# Patient Record
Sex: Female | Born: 1940
Health system: Southern US, Community
[De-identification: ages and names within clinical notes are randomized; demographics above are authoritative.]

## PROBLEM LIST (undated history)

## (undated) DIAGNOSIS — G43909 Migraine, unspecified, not intractable, without status migrainosus: Secondary | ICD-10-CM

## (undated) DIAGNOSIS — N200 Calculus of kidney: Secondary | ICD-10-CM

## (undated) DIAGNOSIS — I10 Essential (primary) hypertension: Secondary | ICD-10-CM

## (undated) DIAGNOSIS — E785 Hyperlipidemia, unspecified: Secondary | ICD-10-CM

## (undated) HISTORY — PX: CHOLECYSTECTOMY: SHX55

## (undated) HISTORY — PX: CARDIAC ELECTROPHYSIOLOGY MAPPING AND ABLATION: SHX1292

## (undated) HISTORY — PX: APPENDECTOMY: SHX54

## (undated) HISTORY — PX: HEMORROIDECTOMY: SUR656

## (undated) HISTORY — PX: CARDIAC CATHETERIZATION: SHX172

---

## 2011-08-20 ENCOUNTER — Emergency Department (HOSPITAL_COMMUNITY): Payer: Medicare Other

## 2011-08-20 ENCOUNTER — Emergency Department (HOSPITAL_COMMUNITY)
Admission: EM | Admit: 2011-08-20 | Discharge: 2011-08-20 | Disposition: A | Discharge status: Home / Self Care | Payer: Medicare Other | Attending: Emergency Medicine | Admitting: Emergency Medicine

## 2011-08-20 ENCOUNTER — Encounter: Payer: Self-pay | Admitting: *Deleted

## 2011-08-20 ENCOUNTER — Other Ambulatory Visit: Payer: Self-pay

## 2011-08-20 DIAGNOSIS — R42 Dizziness and giddiness: Secondary | ICD-10-CM | POA: Insufficient documentation

## 2011-08-20 DIAGNOSIS — R5383 Other fatigue: Secondary | ICD-10-CM | POA: Insufficient documentation

## 2011-08-20 DIAGNOSIS — E860 Dehydration: Secondary | ICD-10-CM | POA: Insufficient documentation

## 2011-08-20 DIAGNOSIS — R0602 Shortness of breath: Secondary | ICD-10-CM | POA: Insufficient documentation

## 2011-08-20 DIAGNOSIS — R5381 Other malaise: Secondary | ICD-10-CM | POA: Insufficient documentation

## 2011-08-20 DIAGNOSIS — R531 Weakness: Secondary | ICD-10-CM

## 2011-08-20 HISTORY — DX: Essential (primary) hypertension: I10

## 2011-08-20 HISTORY — DX: Calculus of kidney: N20.0

## 2011-08-20 LAB — URINALYSIS, ROUTINE W REFLEX MICROSCOPIC
Ketones, ur: NEGATIVE mg/dL
Leukocytes, UA: NEGATIVE
Nitrite: NEGATIVE
Protein, ur: NEGATIVE mg/dL
Urobilinogen, UA: 0.2 mg/dL (ref 0.0–1.0)

## 2011-08-20 LAB — BASIC METABOLIC PANEL
Calcium: 9.6 mg/dL (ref 8.4–10.5)
Creatinine, Ser: 1.03 mg/dL (ref 0.50–1.10)
GFR calc Af Amer: 63 mL/min — ABNORMAL LOW (ref >90)
GFR calc non Af Amer: 54 mL/min — ABNORMAL LOW (ref >90)
Sodium: 136 mEq/L (ref 135–145)

## 2011-08-20 LAB — CBC
MCH: 27.8 pg (ref 26.0–34.0)
MCV: 81.2 fL (ref 78.0–100.0)
Platelets: 190 10*3/uL (ref 150–400)
RDW: 13.3 % (ref 11.5–15.5)

## 2011-08-20 MED ORDER — SODIUM CHLORIDE 0.9 % IV BOLUS (SEPSIS)
1000.0000 mL | Freq: Once | INTRAVENOUS | Status: AC
  Administered 2011-08-20: 1000 mL via INTRAVENOUS

## 2011-08-20 MED ORDER — SODIUM CHLORIDE 0.9 % IV SOLN: Freq: Once | INTRAVENOUS | Status: DC

## 2011-08-20 NOTE — ED Provider Notes (Addendum)
History     CSN: [id number]  Arrival date & time 08/20/11  4098   First MD Initiated Contact with Patient 08/20/11 1102      Chief Complaint  Patient presents with  . Shortness of Breath    (Consider location/radiation/quality/duration/timing/severity/associated sxs/prior treatment) Patient is a 71 y.o. female presenting with weakness. The history is provided by the patient.  Weakness Primary symptoms do not include headaches, syncope, loss of consciousness, altered mental status, dizziness, focal weakness, fever, nausea or vomiting. The symptoms began 3 to 5 days ago. The symptoms are worsening. The neurological symptoms are diffuse.  Additional symptoms include weakness. Additional symptoms do not include neck stiffness, pain or leg pain.  Pt states she is here visiting from [place], states over the last 3-4 days has been feeling very tired, has had low blood pressure, HR has been up. States all she feels like doing is laying down, when she stands up, becomes very weak and lightheaded. Denies fever, chills, chest pain, abdominal pain, swelling in legs, n/v/d.  Past Medical History  Diagnosis Date  . Hypertension   . Kidney stone     Past Surgical History  Procedure Date  . Cardiac electrophysiology mapping and ablation   . Cardiac catheterization   . Appendectomy   . Hemorroidectomy     No family history on file.  History  Substance Use Topics  . Smoking status: Never Smoker   . Smokeless tobacco: Not on file  . Alcohol Use: No    OB History    Grav Para Term Preterm Abortions TAB SAB Ect Mult Living                  Review of Systems  Constitutional: Negative for fever and chills.  HENT: Negative for ear pain, congestion, sore throat, neck pain and neck stiffness.   Respiratory: Positive for shortness of breath. Negative for choking and chest tightness.   Cardiovascular: Negative for chest pain, leg swelling and syncope.  Gastrointestinal: Negative.  Negative  for nausea and vomiting.  Genitourinary: Negative.   Musculoskeletal: Negative.   Skin: Negative.   Neurological: Positive for weakness and light-headedness. Negative for dizziness, focal weakness, loss of consciousness and headaches.  Psychiatric/Behavioral: Negative.  Negative for altered mental status.    Allergies  Penicillins  Home Medications   Current Outpatient Rx  Name Route Sig Dispense Refill  . AMITRIPTYLINE HCL 75 MG PO TABS Oral Take by mouth at bedtime.      Marland Kitchen METOPROLOL TARTRATE 100 MG PO TABS Oral Take 100 mg by mouth 2 (two) times daily.      Marland Kitchen VALSARTAN-HYDROCHLOROTHIAZIDE 160-12.5 MG PO TABS Oral Take 1 tablet by mouth daily.        BP 100/63  Pulse 118  Temp(Src) 97.8 F (36.6 C) (Oral)  Resp 16  Wt 165 lb (74.844 kg)  SpO2 100%  Physical Exam  Nursing note and vitals reviewed. Constitutional: She is oriented to person, place, and time. She appears well-developed and well-nourished. No distress.  HENT:  Head: Normocephalic and atraumatic.  Eyes: Conjunctivae are normal.  Neck: Neck supple.  Cardiovascular: Regular rhythm and normal heart sounds.        tachycardic  Pulmonary/Chest: Effort normal and breath sounds normal. No respiratory distress. She has no wheezes. She has no rales.  Abdominal: Soft. Bowel sounds are normal. There is no tenderness.  Musculoskeletal: Normal range of motion. She exhibits no edema.  Neurological: She is alert and oriented to person, place,  and time. She has normal reflexes. No cranial nerve deficit. She exhibits normal muscle tone. Coordination normal.  Skin: Skin is warm and dry. No erythema.  Psychiatric: She has a normal mood and affect.    ED Course  Procedures (including critical care time)  Pt appears dehydrated, in NAD. BP marginally low, orthostatic. Will start fluids, labs.   Results for orders placed during the hospital encounter of 08/20/11  CBC      Component Value Range   WBC 5.0  4.0 - 10.5 (K/uL)     RBC 4.89  3.87 - 5.11 (MIL/uL)   Hemoglobin 13.6  12.0 - 15.0 (g/dL)   HCT 40.9  81.1 - 91.4 (%)   MCV 81.2  78.0 - 100.0 (fL)   MCH 27.8  26.0 - 34.0 (pg)   MCHC 34.3  30.0 - 36.0 (g/dL)   RDW 78.2  95.6 - 21.3 (%)   Platelets 190  150 - 400 (K/uL)  BASIC METABOLIC PANEL      Component Value Range   Sodium 136  135 - 145 (mEq/L)   Potassium 3.8  3.5 - 5.1 (mEq/L)   Chloride 100  96 - 112 (mEq/L)   CO2 24  19 - 32 (mEq/L)   Glucose, Bld 110 (*) 70 - 99 (mg/dL)   BUN 38 (*) 6 - 23 (mg/dL)   Creatinine, Ser 0.86  0.50 - 1.10 (mg/dL)   Calcium 9.6  8.4 - 57.8 (mg/dL)   GFR calc non Af Amer 54 (*) >90 (mL/min)   GFR calc Af Amer 63 (*) >90 (mL/min)  URINALYSIS, ROUTINE W REFLEX MICROSCOPIC      Component Value Range   Color, Urine YELLOW  YELLOW    APPearance CLEAR  CLEAR    Specific Gravity, Urine 1.016  1.005 - 1.030    pH 5.5  5.0 - 8.0    Glucose, UA NEGATIVE  NEGATIVE (mg/dL)   Hgb urine dipstick NEGATIVE  NEGATIVE    Bilirubin Urine NEGATIVE  NEGATIVE    Ketones, ur NEGATIVE  NEGATIVE (mg/dL)   Protein, ur NEGATIVE  NEGATIVE (mg/dL)   Urobilinogen, UA 0.2  0.0 - 1.0 (mg/dL)   Nitrite NEGATIVE  NEGATIVE    Leukocytes, UA NEGATIVE  NEGATIVE    Dg Chest 2 View  08/20/2011  *RADIOLOGY REPORT*  Clinical Data: Shortness of breath  CHEST - 2 VIEW  Comparison: None  Findings: Cardiomediastinal silhouette is unremarkable.  No acute infiltrate or pleural effusion.  No pulmonary edema.  Mild hyperinflation.  Mild degenerative changes mid thoracic spine. There is a nodular lesion in the right midlung measures about 8 mm. Further evaluation with CT of the chest is recommended.  IMPRESSION: Mild hyperinflation.  Nodular lesion right midlung measures about 8 mm.  Further evaluation with CT of the chest is recommended. No acute infiltrate or pulmonary edema.  Original Report Authenticated By: Natasha Mead, M.D.    BUN/Cr 38/1... Suspect dehydration. NS 2 L bolus administered. Pt feeling  better, ambulated in the hallway. VS now normal. Tachycardia resolved. Pt deneis cp, denies SOB. Will d/c home with follow up.    Date: 08/20/2011  Rate: 118  Rhythm: sinus tachycardia  QRS Axis: normal  Intervals: normal  ST/T Wave abnormalities: normal  Conduction Disutrbances:none  Narrative Interpretation:   Old EKG Reviewed: none available     MDM          Lottie Mussel, PA 08/20/11 1515  Lottie Mussel, [place] 09/20/11 1550

## 2011-08-20 NOTE — ED Notes (Signed)
EAV:[id number]> Expected date:<BR> Expected time:<BR> Means of arrival:<BR> Comments:<BR> Hold per charge nurse

## 2011-08-20 NOTE — ED Provider Notes (Signed)
Medical screening examination/treatment/procedure(s) were conducted as a shared visit with non-physician practitioner(s) and myself.  I personally evaluated the patient during the encounter Pt is orthostatic with elevation in BUN suggesting pre-renal, dehydration.  Pt felt much improved after 2L of IVF's.  Can d/c to home, return if worse.    Samantha Mccullough. [name], MD 08/20/11 1529

## 2011-08-20 NOTE — ED Notes (Signed)
Pt states "I started feeling bad on Saturday, I live in [place], [place], I know I am getting oxygen, I just feel I have to sigh with breaths, I feel good when I'm lying down, I just feel tired"

## 2011-08-20 NOTE — ED Notes (Signed)
Pt presents to ER with c/o increased generalized weakness since Saturday; denies SOB at this time, states "I really have not been short of breath its just that I get so weak when I get up to do anything that I just cant make it and have to stop and catch my breath." lung sounds clear bilaterally; 02 sat on RA 100%, respirations unlabored; intermittent nausea since Saturday but denies nausea at this time; bowel sounds normoactive in all quadrants; pt also reports low BP x 2 days when she checks it at home but reports hx of orthostatic hypotension; pt a&o x 4, daughter at bedside.

## 2011-08-20 NOTE — ED Notes (Signed)
Pt reports that she feels better following the first bag of fluids; reports ambulation was easier and she felt less tired.

## 2011-09-20 NOTE — ED Provider Notes (Signed)
See my prior note  Gavin Pound. Alic Hilburn, MD 09/20/11 2300

## 2016-02-13 ENCOUNTER — Encounter (HOSPITAL_COMMUNITY): Payer: Self-pay | Admitting: *Deleted

## 2016-02-13 ENCOUNTER — Observation Stay (HOSPITAL_COMMUNITY)
Admission: EM | Admit: 2016-02-13 | Discharge: 2016-02-14 | Disposition: A | Discharge status: Home / Self Care | Payer: Medicare Other | Attending: Internal Medicine | Admitting: Internal Medicine

## 2016-02-13 ENCOUNTER — Emergency Department (HOSPITAL_COMMUNITY): Payer: Medicare Other

## 2016-02-13 DIAGNOSIS — Z9049 Acquired absence of other specified parts of digestive tract: Secondary | ICD-10-CM | POA: Diagnosis not present

## 2016-02-13 DIAGNOSIS — I160 Hypertensive urgency: Secondary | ICD-10-CM | POA: Insufficient documentation

## 2016-02-13 DIAGNOSIS — E876 Hypokalemia: Secondary | ICD-10-CM | POA: Diagnosis not present

## 2016-02-13 DIAGNOSIS — R079 Chest pain, unspecified: Secondary | ICD-10-CM

## 2016-02-13 DIAGNOSIS — R0781 Pleurodynia: Principal | ICD-10-CM | POA: Insufficient documentation

## 2016-02-13 DIAGNOSIS — I471 Supraventricular tachycardia: Secondary | ICD-10-CM | POA: Insufficient documentation

## 2016-02-13 DIAGNOSIS — I10 Essential (primary) hypertension: Secondary | ICD-10-CM | POA: Diagnosis not present

## 2016-02-13 DIAGNOSIS — E785 Hyperlipidemia, unspecified: Secondary | ICD-10-CM | POA: Diagnosis not present

## 2016-02-13 DIAGNOSIS — G43909 Migraine, unspecified, not intractable, without status migrainosus: Secondary | ICD-10-CM | POA: Diagnosis not present

## 2016-02-13 DIAGNOSIS — Z79899 Other long term (current) drug therapy: Secondary | ICD-10-CM | POA: Insufficient documentation

## 2016-02-13 HISTORY — DX: Migraine, unspecified, not intractable, without status migrainosus: G43.909

## 2016-02-13 HISTORY — DX: Hyperlipidemia, unspecified: E78.5

## 2016-02-13 LAB — BASIC METABOLIC PANEL
Anion gap: 9 (ref 5–15)
BUN: 33 mg/dL — ABNORMAL HIGH (ref 6–20)
CHLORIDE: 101 mmol/L (ref 101–111)
CO2: 24 mmol/L (ref 22–32)
Calcium: 9.5 mg/dL (ref 8.9–10.3)
Creatinine, Ser: 1.07 mg/dL — ABNORMAL HIGH (ref 0.44–1.00)
GFR calc Af Amer: 58 mL/min — ABNORMAL LOW (ref >60)
GFR, EST NON AFRICAN AMERICAN: 50 mL/min — AB (ref >60)
Glucose, Bld: 117 mg/dL — ABNORMAL HIGH (ref 65–99)
POTASSIUM: 3.4 mmol/L — AB (ref 3.5–5.1)
Sodium: 134 mmol/L — ABNORMAL LOW (ref 135–145)

## 2016-02-13 LAB — CBC
HEMATOCRIT: 38.1 % (ref 36.0–46.0)
Hemoglobin: 13 g/dL (ref 12.0–15.0)
MCH: 28.1 pg (ref 26.0–34.0)
MCHC: 34.1 g/dL (ref 30.0–36.0)
MCV: 82.3 fL (ref 78.0–100.0)
PLATELETS: 170 10*3/uL (ref 150–400)
RBC: 4.63 MIL/uL (ref 3.87–5.11)
RDW: 13.1 % (ref 11.5–15.5)
WBC: 6.1 10*3/uL (ref 4.0–10.5)

## 2016-02-13 LAB — I-STAT TROPONIN, ED: Troponin i, poc: 0 ng/mL (ref 0.00–0.08)

## 2016-02-13 NOTE — ED Notes (Signed)
PT states that she began having deep pain in her chest a couple of hours ago; pt states that it was like pleuritic pain to start with; pt states that it has progressed to left sided / substernal chest pain; pt c/o shortness of breath with exertion; nausea and diaphoresis; pt states that her BP has been elevated as well; pt denies radiation of pain

## 2016-02-14 ENCOUNTER — Observation Stay (HOSPITAL_BASED_OUTPATIENT_CLINIC_OR_DEPARTMENT_OTHER): Payer: Medicare Other

## 2016-02-14 ENCOUNTER — Encounter (HOSPITAL_COMMUNITY): Payer: Self-pay

## 2016-02-14 ENCOUNTER — Emergency Department (HOSPITAL_COMMUNITY): Payer: Medicare Other

## 2016-02-14 DIAGNOSIS — I160 Hypertensive urgency: Secondary | ICD-10-CM | POA: Diagnosis present

## 2016-02-14 DIAGNOSIS — R072 Precordial pain: Secondary | ICD-10-CM | POA: Diagnosis not present

## 2016-02-14 DIAGNOSIS — E876 Hypokalemia: Secondary | ICD-10-CM | POA: Diagnosis not present

## 2016-02-14 DIAGNOSIS — G43909 Migraine, unspecified, not intractable, without status migrainosus: Secondary | ICD-10-CM | POA: Diagnosis present

## 2016-02-14 DIAGNOSIS — I10 Essential (primary) hypertension: Secondary | ICD-10-CM | POA: Diagnosis not present

## 2016-02-14 DIAGNOSIS — R079 Chest pain, unspecified: Secondary | ICD-10-CM

## 2016-02-14 DIAGNOSIS — E785 Hyperlipidemia, unspecified: Secondary | ICD-10-CM

## 2016-02-14 DIAGNOSIS — R0781 Pleurodynia: Secondary | ICD-10-CM | POA: Diagnosis not present

## 2016-02-14 LAB — ECHOCARDIOGRAM COMPLETE
CHL CUP DOP CALC LVOT VTI: 18.5 cm
E decel time: 169 msec
EERAT: 7.93
FS: 29 % (ref 28–44)
HEIGHTINCHES: 66 in
IVS/LV PW RATIO, ED: 1.11
LA ID, A-P, ES: 30 mm
LA diam end sys: 30 mm
LA diam index: 1.6 cm/m2
LA vol A4C: 22.3 ml
LA vol: 22.8 mL
LAVOLIN: 12.2 mL/m2
LDCA: 2.01 cm2
LV E/e' medial: 7.93
LV PW d: 9.07 mm — AB (ref 0.6–1.1)
LV TDI E'LATERAL: 9.36
LV TDI E'MEDIAL: 5.77
LV e' LATERAL: 9.36 cm/s
LVEEAVG: 7.93
LVOT diameter: 16 mm
LVOTPV: 91.2 cm/s
LVOTSV: 37 mL
MV Dec: 169
MV pk A vel: 99.4 m/s
MV pk E vel: 74.2 m/s
MVPG: 2 mmHg
WEIGHTICAEL: 2721.6 [oz_av]

## 2016-02-14 LAB — TROPONIN I: Troponin I: <0.03 ng/mL (ref <0.03)

## 2016-02-14 LAB — LIPID PANEL
CHOL/HDL RATIO: 3.4 ratio
CHOLESTEROL: 155 mg/dL (ref 0–200)
HDL: 46 mg/dL (ref >40)
LDL Cholesterol: 83 mg/dL (ref 0–99)
TRIGLYCERIDES: 129 mg/dL (ref <150)
VLDL: 26 mg/dL (ref 0–40)

## 2016-02-14 LAB — POTASSIUM: POTASSIUM: 3.9 mmol/L (ref 3.5–5.1)

## 2016-02-14 LAB — D-DIMER, QUANTITATIVE: D-Dimer, Quant: 0.71 ug/mL-FEU — ABNORMAL HIGH (ref 0.00–0.50)

## 2016-02-14 MED ORDER — POTASSIUM CHLORIDE 20 MEQ/15ML (10%) PO SOLN
20.0000 meq | Freq: Once | ORAL | Status: AC
  Administered 2016-02-14: 20 meq via ORAL
  Filled 2016-02-14: qty 15

## 2016-02-14 MED ORDER — NITROGLYCERIN 0.4 MG SL SUBL: 0.4000 mg | SUBLINGUAL_TABLET | SUBLINGUAL | Status: AC | PRN

## 2016-02-14 MED ORDER — HYDRALAZINE HCL 20 MG/ML IJ SOLN: 5.0000 mg | INTRAMUSCULAR | Status: DC | PRN

## 2016-02-14 MED ORDER — IRBESARTAN 150 MG PO TABS
150.0000 mg | ORAL_TABLET | Freq: Every day | ORAL | Status: DC
  Administered 2016-02-14: 150 mg via ORAL
  Filled 2016-02-14: qty 1

## 2016-02-14 MED ORDER — ASPIRIN 81 MG PO CHEW
324.0000 mg | CHEWABLE_TABLET | Freq: Once | ORAL | Status: AC
  Administered 2016-02-14: 324 mg via ORAL
  Filled 2016-02-14: qty 4

## 2016-02-14 MED ORDER — AMITRIPTYLINE HCL 25 MG PO TABS: 75.0000 mg | ORAL_TABLET | Freq: Every day | ORAL | Status: DC

## 2016-02-14 MED ORDER — OMEGA-3-ACID ETHYL ESTERS 1 G PO CAPS
1.0000 g | ORAL_CAPSULE | Freq: Every day | ORAL | Status: DC
  Administered 2016-02-14: 1 g via ORAL
  Filled 2016-02-14: qty 1

## 2016-02-14 MED ORDER — IOPAMIDOL (ISOVUE-370) INJECTION 76%
100.0000 mL | Freq: Once | INTRAVENOUS | Status: AC | PRN
  Administered 2016-02-14: 100 mL via INTRAVENOUS

## 2016-02-14 MED ORDER — FLAXSEED OIL 1000 MG PO CAPS: 1000.0000 mg | ORAL_CAPSULE | Freq: Two times a day (BID) | ORAL | Status: DC

## 2016-02-14 MED ORDER — METOPROLOL TARTRATE 25 MG PO TABS
100.0000 mg | ORAL_TABLET | Freq: Two times a day (BID) | ORAL | Status: DC
  Administered 2016-02-14: 100 mg via ORAL
  Filled 2016-02-14: qty 4

## 2016-02-14 MED ORDER — CALCIUM CARBONATE-VITAMIN D 500-200 MG-UNIT PO TABS
1.0000 | ORAL_TABLET | Freq: Two times a day (BID) | ORAL | Status: DC
  Administered 2016-02-14: 1 via ORAL
  Filled 2016-02-14: qty 1

## 2016-02-14 MED ORDER — ONDANSETRON HCL 4 MG/2ML IJ SOLN
4.0000 mg | Freq: Four times a day (QID) | INTRAMUSCULAR | Status: DC | PRN
Start: 2016-02-14 — End: 2016-02-14

## 2016-02-14 MED ORDER — MORPHINE SULFATE (PF) 2 MG/ML IV SOLN: 2.0000 mg | INTRAVENOUS | Status: DC | PRN

## 2016-02-14 MED ORDER — SODIUM CHLORIDE 0.9 % IV SOLN
INTRAVENOUS | Status: DC
  Administered 2016-02-14: 03:00:00 via INTRAVENOUS

## 2016-02-14 MED ORDER — NITROGLYCERIN 0.4 MG SL SUBL: 0.4000 mg | SUBLINGUAL_TABLET | SUBLINGUAL | Status: DC | PRN

## 2016-02-14 MED ORDER — HEPARIN SODIUM (PORCINE) 5000 UNIT/ML IJ SOLN
5000.0000 [IU] | Freq: Three times a day (TID) | INTRAMUSCULAR | Status: DC
  Administered 2016-02-14 (×2): 5000 [IU] via SUBCUTANEOUS
  Filled 2016-02-14 (×2): qty 1

## 2016-02-14 MED ORDER — HYDROCHLOROTHIAZIDE 25 MG PO TABS
25.0000 mg | ORAL_TABLET | Freq: Every day | ORAL | Status: DC
  Administered 2016-02-14: 25 mg via ORAL
  Filled 2016-02-14: qty 1

## 2016-02-14 MED ORDER — ASPIRIN 81 MG PO CHEW
324.0000 mg | CHEWABLE_TABLET | Freq: Every day | ORAL | Status: DC
  Administered 2016-02-14: 324 mg via ORAL
  Filled 2016-02-14: qty 4

## 2016-02-14 MED ORDER — ACETAMINOPHEN 325 MG PO TABS: 650.0000 mg | ORAL_TABLET | ORAL | Status: DC | PRN

## 2016-02-14 MED ORDER — VALSARTAN-HYDROCHLOROTHIAZIDE 160-25 MG PO TABS: 1.0000 | ORAL_TABLET | Freq: Every day | ORAL | Status: DC

## 2016-02-14 MED ORDER — ATORVASTATIN CALCIUM 20 MG PO TABS: 20.0000 mg | ORAL_TABLET | Freq: Every day | ORAL | Status: DC

## 2016-02-14 NOTE — Consult Note (Signed)
CARDIOLOGY CONSULT NOTE   Patient ID: Samantha Guysancy Campanella MRN: [id number] DOB/AGE: [birth year] 74 y.o.  Admit date: 02/13/2016  Primary Physician   No primary care provider on file. Primary Cardiologist   [place] [place] [place] Reason for Consultation   Chest pain  Requesting Physician  Dr. Thedore MinsSingh  HPI: Samantha Mccullough is a 75 y.o. female with a history of HLD, HTN, PSVT s/p ablation 1995, kidney stone who presented for chest pain.   She is resident of [place]. Visiting her daughter. She drove here last week. Hx. of PSVT status post ablation in 1995. Reoccurrence for more than 15 years. Normal cath about 15 years ago. Hasn't seen by cardiologist for many years. Managed by primary care provider, last seen 12/2015. Negative workup at that time she has a chronic dyspnea on exertion and echo was normal last month.  Yesterday she had a episode of chest pain. It was started on her left upper back and radiated to right upper chest. She described the pain as achy in character. Was associated with dyspnea and nausea. Lasted for a few hours. Resolved by the time sig came to the ER for further evaluation. Denies lower extremity edema, palpitation, orthopnea, PND, syncope, abdominal pain, vomiting, melena or blood in her stool. Chest pain worsened with deep breath.  Upon arrival to ED her blood pressure was elevated to 173/109 which improved to 145/76. D-dimer elevated. CT and negative for PE however showed 9 mm right lower lobe pulmonary nodule. The patient states that she has these chronically. Previously evaluated by pulmonology and cleared. Troponin x 3 negative. Lower extremity Doppler negative for DVT on preliminary result however evidence of right Baker's cyst, not left Baker's cyst. Echocardiogram during this admission shows left ventricle function of 55-60%, no wall motion abnormality, grade 1 diastolic dysfunction. No further chest pain and was to go home. [place] and then returned back to  [place].    Past Medical History  Diagnosis Date  . Hypertension   . Kidney stone   . Migraine   . HLD (hyperlipidemia)      Past Surgical History  Procedure Laterality Date  . Cardiac electrophysiology mapping and ablation    . Cardiac catheterization    . Appendectomy    . Hemorroidectomy    . Cholecystectomy      Allergies  Allergen Reactions  . Penicillins Anaphylaxis    I have reviewed the patient's current medications . amitriptyline  75 mg Oral QHS  . aspirin  324 mg Oral Daily  . atorvastatin  20 mg Oral Daily  . calcium-vitamin D  1 tablet Oral BID  . heparin  5,000 Units Subcutaneous Q8H  . irbesartan  150 mg Oral Daily   And  . hydrochlorothiazide  25 mg Oral Daily  . metoprolol  100 mg Oral BID  . omega-3 acid ethyl esters  1 g Oral Daily   . sodium chloride 75 mL/hr at 02/14/16 0250   acetaminophen, hydrALAZINE, morphine injection, nitroGLYCERIN, ondansetron (ZOFRAN) IV  Prior to Admission medications   Medication Sig Start Date End Date Taking? Authorizing Provider  amitriptyline (ELAVIL) 75 MG tablet Take by mouth at bedtime.     Yes Historical Provider, MD  atorvastatin (LIPITOR) 20 MG tablet Take 1 tablet by mouth daily. 12/07/15  Yes Historical Provider, MD  calcium-vitamin D (OSCAL WITH D) 500-200 MG-UNIT tablet Take 1 tablet by mouth 2 (two) times daily.   Yes Historical Provider, MD  Flaxseed, Linseed, (  FLAXSEED OIL) 1000 MG CAPS Take 1,000 mg by mouth 2 (two) times daily.     Yes Historical Provider, MD  metoprolol (LOPRESSOR) 100 MG tablet Take 100 mg by mouth 2 (two) times daily.     Yes Historical Provider, MD  Omega-3 Fatty Acids (FISH OIL) 1200 MG CAPS Take 1 capsule by mouth 2 (two) times daily.    Yes Historical Provider, MD  valsartan-hydrochlorothiazide (DIOVAN-HCT) 160-25 MG tablet Take 1 tablet by mouth daily. 02/06/16  Yes Historical Provider, MD     Social History   Social History  . Marital Status: Widowed    Spouse Name: N/A  .  Number of Children: N/A  . Years of Education: N/A   Occupational History  . Not on file.   Social History Main Topics  . Smoking status: Never Smoker   . Smokeless tobacco: Not on file  . Alcohol Use: No  . Drug Use: No  . Sexual Activity: Not on file   Other Topics Concern  . Not on file   Social History Narrative    No family status information on file.   Family History  Problem Relation Age of Onset  . Dementia Mother   . Pulmonary fibrosis Father        ROS:  Full 14 point review of systems complete and found to be negative unless listed above.  Physical Exam: Blood pressure 111/51, pulse 73, temperature 97.7 F (36.5 C), temperature source Oral, resp. rate 14, height  (1.676 m), weight 170 lb 1.6 oz (77.157 kg), SpO2 95 %.  General: Well developed, well nourished, female in no acute distress Head: Eyes PERRLA, No xanthomas. Normocephalic and atraumatic, oropharynx without edema or exudate.  Lungs: Resp regular and unlabored, CTA. Heart: RRR no s3, s4, or murmurs..   Neck: No carotid bruits. No lymphadenopathy. No  JVD. Abdomen: Bowel sounds present, abdomen soft and non-tender without masses or hernias noted. Msk:  No spine or cva tenderness. No weakness, no joint deformities or effusions. Extremities: No clubbing, cyanosis or edema. DP/PT/Radials 2+ and equal bilaterally. Neuro: Alert and oriented X 3. No focal deficits noted. Psych:  Good affect, responds appropriately Skin: No rashes or lesions noted.  Labs:   Lab Results  Component Value Date   WBC 6.1 02/13/2016   HGB 13.0 02/13/2016   HCT 38.1 02/13/2016   MCV 82.3 02/13/2016   PLT 170 02/13/2016   No results for input(s): INR in the last 72 hours.  Recent Labs Lab 02/13/16 2253 02/14/16 0818  NA 134*  --   K 3.4* 3.9  CL 101  --   CO2 24  --   BUN 33*  --   CREATININE 1.07*  --   CALCIUM 9.5  --   GLUCOSE 117*  --    No results found for: MG  Recent Labs  02/14/16 0255  02/14/16 0513 02/14/16 0818  TROPONINI <0.03 <0.03 <0.03    Recent Labs  02/13/16 2306  TROPIPOC 0.00   No results found for: PROBNP Lab Results  Component Value Date   CHOL 155 02/14/2016   HDL 46 02/14/2016   LDLCALC 83 02/14/2016   TRIG 129 02/14/2016   Lab Results  Component Value Date   DDIMER 0.71* 02/13/2016   No results found for: LIPASE, AMYLASE No results found for: TSH, T4TOTAL, T3FREE, THYROIDAB No results found for: VITAMINB12, FOLATE, FERRITIN, TIBC, IRON, RETICCTPCT  Echo: 02/14/16 LV EF: 55% - 60%  ------------------------------------------------------------------- Indications: Chest pain 786.51.  -------------------------------------------------------------------  History: Risk factors: Hypertension.  ------------------------------------------------------------------- Study Conclusions  - Left ventricle: The cavity size was normal. Wall thickness was  normal. Systolic function was normal. The estimated ejection  fraction was in the range of 55% to 60%. Wall motion was normal;  there were no regional wall motion abnormalities. Doppler  parameters are consistent with abnormal left ventricular  relaxation (grade 1 diastolic dysfunction). The E/e&' ratio is  between 8-15, suggesting indeterminate LV filling pressure. - Left atrium: The atrium was normal in size. - Tricuspid valve: There was no significant regurgitation. - Inferior vena cava: The vessel was normal in size. The  respirophasic diameter changes were in the normal range (>= 50%),  consistent with normal central venous pressure.  Impressions:  ECG:  *EKG shows sinus rhythm at rate of 70 bpm. Prolonged PR interval. No acute changes.  Radiology:  Dg Chest 2 View  02/13/2016  CLINICAL DATA:  75 year old female with chest pain EXAM: CHEST  2 VIEW COMPARISON:  Chest radiograph dated 08/20/2011 FINDINGS: The heart size and mediastinal contours are within normal limits.  Both lungs are clear. The visualized skeletal structures are unremarkable. IMPRESSION: No active cardiopulmonary disease. Electronically Signed   By: Elgie Collard M.D.   On: 02/13/2016 22:32   Ct Angio Chest Pe W/cm &/or Wo Cm  02/14/2016  CLINICAL DATA:  75 year old female with pleuritic chest pain and shortness of breath EXAM: CT ANGIOGRAPHY CHEST WITH CONTRAST TECHNIQUE: Multidetector CT imaging of the chest was performed using the standard protocol during bolus administration of intravenous contrast. Multiplanar CT image reconstructions and MIPs were obtained to evaluate the vascular anatomy. CONTRAST:  100 cc Isovue 370 COMPARISON:  Chest radiograph dated 02/13/2016 FINDINGS: There are dependent atelectatic changes of the lung bases. There is a 9 mm oval pulmonary nodules at the right lung base with smooth borders and central calcification. The pattern of calcification is most suggestive of a benign process such as a granuloma or a hamartoma. Follow-up is recommended. The lungs are otherwise clear. There is no pleural effusion or pneumothorax. The central airways are patent. There is atherosclerotic calcification of the thoracic aorta. The origins of the great vessels of the aortic arch appear patent. There is no CT evidence of pulmonary embolism. No cardiomegaly or pericardial effusion. Right hilar adenopathy noted. There is a small hiatal hernia. The esophagus is grossly normal no thyroid nodules identified. There is no axillary adenopathy. The chest wall soft tissues appear unremarkable. There is osteopenia with degenerative changes of the spine. No acute fracture. The visualized upper abdomen appears unremarkable. Review of the MIP images confirms the above findings. IMPRESSION: No acute intrathoracic pathology. No CT evidence of pulmonary embolism. A 9 mm right lower lobe pulmonary nodule with central calcification likely a benign granuloma or hamartoma. Consider one of the following in 3 months for  both low-risk and high-risk individuals: (a) repeat chest CT, (b) follow-up PET-CT, or (c) tissue sampling. This recommendation follows the consensus statement: Guidelines for Management of Incidental Pulmonary Nodules Detected on CT Images:From the Fleischner Society 2017; published online before print (10.1148/radiol.9528413244). Electronically Signed   By: Elgie Collard M.D.   On: 02/14/2016 02:36    ASSESSMENT AND PLAN:     1. Chest pain - Presented with pleuritic chest pain in setting of elevated blood pressure. History of recent trauma however negative for DVT or PE. - Had a normal cath about 15 years ago. She has ruled out. Troponin negative. EKG without acute changes. Echo reassuring. Normal LV  function without wall motion abnormality. - She is going [place] the beach for vacation with family tomorrow and then [place] to [place]. Will discuss with MD.   2. Hypertensive urgency - Improved. Continue current mediations.   3. PSVT s/p ablation 1995   Signed: Ura Hausen, PA 02/14/2016, 12:22 PM Pager [phone]  Co-Sign MD

## 2016-02-14 NOTE — H&P (Signed)
History and Physical    Samantha Guysancy Donaway AVW:[id number] DOB: 17-May-1941 DOA: 02/13/2016  Referring MD/NP/PA:   PCP: No primary care provider on file.   Patient coming from:  The patient is coming from home.  At baseline, pt is independent for most of ADL.      Chief Complaint: Chest pain and shortness breath  HPI: Samantha Mccullough is a 75 y.o. female with medical history significant of hypertension, hyperlipidemia, migraine headaches, kidney stone, SVT (as a/P ablation), who presents with chest pain and shortness of breath.  Patient reports that she started having chest pain today. It is located in the substernal area, constant, 5 out of 10 in severity, dull, radiating to the left shoulder. It is pleuritic and aggravated by deep breath. It is associated with nausea and diaphoresis. Patient denies fever, chills, cough. Of note patient recently traveled here from [place] by driving. She does not have tenderness over calf areas. Patient denies abdominal pain, symptoms of UTI, vomiting, unilateral weakness. She had loose stool yesterday, but no diarrhea today. Patient had elevated blood pressure 173/109 ED, which improved to 145/76. Currently patient is chest pain-free.  ED Course: pt was found to have positive d-dimer 0.71, negative troponin, WBC 6.1, temperature normal, no tachycardia, no tachypnea, potassium 3.4, creatinine 1.07, negative chest x-ray for acute abnormalities. Patient on telemetry bed for observation.  Review of Systems:   General: no fevers, chills, no changes in body weight, has fatigue HEENT: no blurry vision, hearing changes or sore throat Pulm: no dyspnea, coughing, wheezing CV: has chest pain, no palpitations Abd: has nausea, no vomiting, abdominal pain, diarrhea, constipation GU: no dysuria, burning on urination, increased urinary frequency, hematuria  Ext: no leg edema Neuro: no unilateral weakness, numbness, or tingling, no vision change or hearing loss Skin: no rash MSK: No  muscle spasm, no deformity, no limitation of range of movement in spin Heme: No easy bruising.  Travel history: No recent long distant travel.  Allergy:  Allergies  Allergen Reactions  . Penicillins Anaphylaxis    Past Medical History  Diagnosis Date  . Hypertension   . Kidney stone   . Migraine   . HLD (hyperlipidemia)     Past Surgical History  Procedure Laterality Date  . Cardiac electrophysiology mapping and ablation    . Cardiac catheterization    . Appendectomy    . Hemorroidectomy    . Cholecystectomy      Social History:  reports that she has never smoked. She does not have any smokeless tobacco history on file. She reports that she does not drink alcohol or use illicit drugs.  Family History:  Family History  Problem Relation Age of Onset  . Dementia Mother   . Pulmonary fibrosis Father      Prior to Admission medications   Medication Sig Start Date End Date Taking? Authorizing Provider  amitriptyline (ELAVIL) 75 MG tablet Take by mouth at bedtime.     Yes Historical Provider, MD  atorvastatin (LIPITOR) 20 MG tablet Take 1 tablet by mouth daily. 12/07/15  Yes Historical Provider, MD  calcium-vitamin D (OSCAL WITH D) 500-200 MG-UNIT tablet Take 1 tablet by mouth 2 (two) times daily.   Yes Historical Provider, MD  Flaxseed, Linseed, (FLAXSEED OIL) 1000 MG CAPS Take 1,000 mg by mouth 2 (two) times daily.     Yes Historical Provider, MD  metoprolol (LOPRESSOR) 100 MG tablet Take 100 mg by mouth 2 (two) times daily.     Yes Historical Provider, MD  Omega-3 Fatty Acids (FISH OIL) 1200 MG CAPS Take 1 capsule by mouth 2 (two) times daily.    Yes Historical Provider, MD  valsartan-hydrochlorothiazide (DIOVAN-HCT) 160-25 MG tablet Take 1 tablet by mouth daily. 02/06/16  Yes Historical Provider, MD    Physical Exam: Filed Vitals:   02/13/16 2204 02/14/16 0048 02/14/16 0100 02/14/16 0325  BP: 193/109 145/76 156/79   Pulse: 80 69 70   Temp: 97.7 F (36.5 C)       TempSrc: Oral     Resp: 18 16 15    Height: 5\' 6"  (1.676 m)   5\' 6"  (1.676 m)  Weight: 79.379 kg (175 lb)   77.111 kg (170 lb)  SpO2: 100% 97% 100%    General: Not in acute distress HEENT:       Eyes: PERRL, EOMI, no scleral icterus.       ENT: No discharge from the ears and nose, no pharynx injection, no tonsillar enlargement.        Neck: No JVD, no bruit, no mass felt. Heme: No neck lymph node enlargement. Cardiac: S1/S2, RRR, No murmurs, No gallops or rubs. Pulm: No rales, wheezing, rhonchi or rubs. Abd: Soft, nondistended, nontender, no rebound pain, no organomegaly, BS present. GU: No hematuria Ext: No pitting leg edema bilaterally. 2+DP/PT pulse bilaterally. Musculoskeletal: No joint deformities, No joint redness or warmth, no limitation of ROM in spin. Skin: No rashes.  Neuro: Alert, oriented X3, cranial nerves II-XII grossly intact, moves all extremities normally. Psych: Patient is not psychotic, no suicidal or hemocidal ideation.  Labs on Admission: I have personally reviewed following labs and imaging studies  CBC:  Recent Labs Lab 02/13/16 2253  WBC 6.1  HGB 13.0  HCT 38.1  MCV 82.3  PLT 170   Basic Metabolic Panel:  Recent Labs Lab 02/13/16 2253  NA 134*  K 3.4*  CL 101  CO2 24  GLUCOSE 117*  BUN 33*  CREATININE 1.07*  CALCIUM 9.5   GFR: Estimated Creatinine Clearance: 48.4 mL/min (by C-G formula based on Cr of 1.07). Liver Function Tests: No results for input(s): AST, ALT, ALKPHOS, BILITOT, PROT, ALBUMIN in the last 168 hours. No results for input(s): LIPASE, AMYLASE in the last 168 hours. No results for input(s): AMMONIA in the last 168 hours. Coagulation Profile: No results for input(s): INR, PROTIME in the last 168 hours. Cardiac Enzymes: No results for input(s): CKTOTAL, CKMB, CKMBINDEX, TROPONINI in the last 168 hours. BNP (last 3 results) No results for input(s): PROBNP in the last 8760 hours. HbA1C: No results for input(s): HGBA1C in  the last 72 hours. CBG: No results for input(s): GLUCAP in the last 168 hours. Lipid Profile: No results for input(s): CHOL, HDL, LDLCALC, TRIG, CHOLHDL, LDLDIRECT in the last 72 hours. Thyroid Function Tests: No results for input(s): TSH, T4TOTAL, FREET4, T3FREE, THYROIDAB in the last 72 hours. Anemia Panel: No results for input(s): VITAMINB12, FOLATE, FERRITIN, TIBC, IRON, RETICCTPCT in the last 72 hours. Urine analysis:    Component Value Date/Time   COLORURINE YELLOW 08/20/2011 1401   APPEARANCEUR CLEAR 08/20/2011 1401   LABSPEC 1.016 08/20/2011 1401   PHURINE 5.5 08/20/2011 1401   GLUCOSEU NEGATIVE 08/20/2011 1401   HGBUR NEGATIVE 08/20/2011 1401   BILIRUBINUR NEGATIVE 08/20/2011 1401   KETONESUR NEGATIVE 08/20/2011 1401   PROTEINUR NEGATIVE 08/20/2011 1401   UROBILINOGEN 0.2 08/20/2011 1401   NITRITE NEGATIVE 08/20/2011 1401   LEUKOCYTESUR NEGATIVE 08/20/2011 1401   Sepsis Labs: @LABRCNTIP (procalcitonin:4,lacticidven:4) )No results found for this or any  previous visit (from the past 240 hour(s)).   Radiological Exams on Admission: Dg Chest 2 View  02/13/2016  CLINICAL DATA:  75 year old female with chest pain EXAM: CHEST  2 VIEW COMPARISON:  Chest radiograph dated 08/20/2011 FINDINGS: The heart size and mediastinal contours are within normal limits. Both lungs are clear. The visualized skeletal structures are unremarkable. IMPRESSION: No active cardiopulmonary disease. Electronically Signed   By: Elgie Collard M.D.   On: 02/13/2016 22:32   Ct Angio Chest Pe W/cm &/or Wo Cm  02/14/2016  CLINICAL DATA:  75 year old female with pleuritic chest pain and shortness of breath EXAM: CT ANGIOGRAPHY CHEST WITH CONTRAST TECHNIQUE: Multidetector CT imaging of the chest was performed using the standard protocol during bolus administration of intravenous contrast. Multiplanar CT image reconstructions and MIPs were obtained to evaluate the vascular anatomy. CONTRAST:  100 cc Isovue 370  COMPARISON:  Chest radiograph dated 02/13/2016 FINDINGS: There are dependent atelectatic changes of the lung bases. There is a 9 mm oval pulmonary nodules at the right lung base with smooth borders and central calcification. The pattern of calcification is most suggestive of a benign process such as a granuloma or a hamartoma. Follow-up is recommended. The lungs are otherwise clear. There is no pleural effusion or pneumothorax. The central airways are patent. There is atherosclerotic calcification of the thoracic aorta. The origins of the great vessels of the aortic arch appear patent. There is no CT evidence of pulmonary embolism. No cardiomegaly or pericardial effusion. Right hilar adenopathy noted. There is a small hiatal hernia. The esophagus is grossly normal no thyroid nodules identified. There is no axillary adenopathy. The chest wall soft tissues appear unremarkable. There is osteopenia with degenerative changes of the spine. No acute fracture. The visualized upper abdomen appears unremarkable. Review of the MIP images confirms the above findings. IMPRESSION: No acute intrathoracic pathology. No CT evidence of pulmonary embolism. A 9 mm right lower lobe pulmonary nodule with central calcification likely a benign granuloma or hamartoma. Consider one of the following in 3 months for both low-risk and high-risk individuals: (a) repeat chest CT, (b) follow-up PET-CT, or (c) tissue sampling. This recommendation follows the consensus statement: Guidelines for Management of Incidental Pulmonary Nodules Detected on CT Images:From the Fleischner Society 2017; published online before print (10.1148/radiol.4098119147). Electronically Signed   By: Elgie Collard M.D.   On: 02/14/2016 02:36     EKG: Independently reviewed. Sinus rhythm, QTC 442, early R-wave progression, no ischemic change.  Assessment/Plan Principal Problem:   Chest pain Active Problems:   Hypertension   Hypertensive urgency    Hypokalemia   Migraine   HLD (hyperlipidemia)   Chest pain: Etiology is not clear. Differential diagnoses include demending ischemia given blood pressure 193/109 and pulmonary embolism given elevated d-dimer, recent elongated and the traveling, and appropriate chest pain. Initial troponin is negative. EKG has no ischemic change.  - will place on Tele bed for obs - cycle CE q6 x3 and repeat her EKG in the am  - Nitroglycerin, Morphine, and aspirin, lipitor and metoprolol - Risk factor stratification: will check FLP and A1C  - 2d echo - please call Card in AM - will get CTA to r/o PE - LE doppler to r/o DVT  Hypertensive urgency: Blood pressure was 193/109, which improved to 145/76. Elevated blood sugar may have contributed to her chest pain due to demanding ischemia. -switch Diovan/HCTZ to irbesartan, HCTZ -continue metoprolol -IV hydralazine when necessary  Hypokalemia: K=3.4 on admission. - Repleted  Migraine:  stable -continue amitriptyline prophylaxis   HLD (hyperlipidemia): no LDL on record -Continue Lipitor -Follow-up FLP  DVT ppx: SQ Heparin (if pt develops severe chest pain or significantly elevated trop, will be easier to switch to IV heparin or stop heparin for procedure than using Lovenox).  Code Status: Full code Family Communication: None at bed side.  Disposition Plan:  Anticipate discharge back to previous home environment Consults called:  none Admission status: Obs / tele  Date of Service 02/14/2016    Lorretta Harp [facility] Pager [phone]  If 7PM-7AM, please contact night-coverage [email or web address] Password [facility] 02/14/2016, 3:28 AM

## 2016-02-14 NOTE — ED Provider Notes (Signed)
CSN: [id number]     Arrival date & time 02/13/16  2153 History   First MD Initiated Contact with Patient 02/13/16 2343     Chief Complaint  Patient presents with  . Chest Pain     (Consider location/radiation/quality/duration/timing/severity/associated sxs/prior Treatment) HPI 75 year old female who presents with chest pain. She has a history of hypertension hyperlipidemia. States onset of chest pain and back pain at 6 PM this evening. States that she has had similar chest pain before in the past in setting of gallstones, but now has had a cholecystectomy. States that she was doing chores around the house when she had onset of pain. Pain dull and pressure-like that was worsened with deep inspiration. She is noted that pain was also worse with activity and had associating dyspnea on exertion as well. States that she was diaphoretic, clammy, and had nausea. No vomiting, abdominal pain, syncope or near syncope. No recent infections, fevers, chills, or cough. States that she took Phenergan and Motrin, and on arrival to the ED pain has been easing off and now is pain-free. Pain not reproduced with palpation or movement. No recent fall, exertion or muscle strain.   Past Medical History  Diagnosis Date  . Hypertension   . Kidney stone    Past Surgical History  Procedure Laterality Date  . Cardiac electrophysiology mapping and ablation    . Cardiac catheterization    . Appendectomy    . Hemorroidectomy    . Cholecystectomy     History reviewed. No pertinent family history. Social History  Substance Use Topics  . Smoking status: Never Smoker   . Smokeless tobacco: None  . Alcohol Use: No   OB History    No data available     Review of Systems 10/14 systems reviewed and are negative other than those stated in the HPI    Allergies  Penicillins  Home Medications   Prior to Admission medications   Medication Sig Start Date End Date Taking? Authorizing Provider  amitriptyline  (ELAVIL) 75 MG tablet Take by mouth at bedtime.     Yes Historical Provider, MD  atorvastatin (LIPITOR) 20 MG tablet Take 1 tablet by mouth daily. 12/07/15  Yes Historical Provider, MD  calcium-vitamin D (OSCAL WITH D) 500-200 MG-UNIT tablet Take 1 tablet by mouth 2 (two) times daily.   Yes Historical Provider, MD  Flaxseed, Linseed, (FLAXSEED OIL) 1000 MG CAPS Take 1,000 mg by mouth 2 (two) times daily.     Yes Historical Provider, MD  metoprolol (LOPRESSOR) 100 MG tablet Take 100 mg by mouth 2 (two) times daily.     Yes Historical Provider, MD  Omega-3 Fatty Acids (FISH OIL) 1200 MG CAPS Take 1 capsule by mouth 2 (two) times daily.    Yes Historical Provider, MD  valsartan-hydrochlorothiazide (DIOVAN-HCT) 160-25 MG tablet Take 1 tablet by mouth daily. 02/06/16  Yes Historical Provider, MD   BP 145/76 mmHg  Pulse 69  Temp(Src) 97.7 F (36.5 C) (Oral)  Resp 16  Ht 5\' 6"  (1.676 m)  Wt 175 lb (79.379 kg)  BMI 28.26 kg/m2  SpO2 97% Physical Exam Physical Exam  Nursing note and vitals reviewed. Constitutional: Well developed, well nourished, non-toxic, and in no acute distress Head: Normocephalic and atraumatic.  Mouth/Throat: Oropharynx is clear and moist.  Neck: Normal range of motion. Neck supple.  Cardiovascular: Normal rate and regular rhythm.   Pulmonary/Chest: Effort normal and breath sounds normal.  Abdominal: Soft. There is no tenderness. There is no rebound and  no guarding.  Musculoskeletal: Normal range of motion.  Neurological: Alert, no facial droop, fluent speech, moves all extremities symmetrically Skin: Skin is warm and dry.  Psychiatric: Cooperative  ED Course  Procedures (including critical care time) Labs Review Labs Reviewed  BASIC METABOLIC PANEL - Abnormal; Notable for the following:    Sodium 134 (*)    Potassium 3.4 (*)    Glucose, Bld 117 (*)    BUN 33 (*)    Creatinine, Ser 1.07 (*)    GFR calc non Af Amer 50 (*)    GFR calc Af Amer 58 (*)    All other  components within normal limits  D-DIMER, QUANTITATIVE (NOT AT [facility]) - Abnormal; Notable for the following:    D-Dimer, Quant 0.71 (*)    All other components within normal limits  CBC  I-STAT TROPOININ, ED    Imaging Review Dg Chest 2 View  02/13/2016  CLINICAL DATA:  75 year old female with chest pain EXAM: CHEST  2 VIEW COMPARISON:  Chest radiograph dated 08/20/2011 FINDINGS: The heart size and mediastinal contours are within normal limits. Both lungs are clear. The visualized skeletal structures are unremarkable. IMPRESSION: No active cardiopulmonary disease. Electronically Signed   By: Elgie CollardArash  Radparvar M.D.   On: 02/13/2016 22:32   I have personally reviewed and evaluated these images and lab results as part of my medical decision-making.   EKG Interpretation   Date/Time:  Wednesday February 13 2016 22:16:28 EDT Ventricular Rate:  77 PR Interval:    QRS Duration: 90 QT Interval:  390 QTC Calculation: 442 R Axis:   64 Text Interpretation:  Sinus rhythm Prolonged PR interval Abnormal R-wave  progression, early transition Baseline wander in lead(s) V6 No significant  change since last tracing Confirmed by Zenith Kercheval MD, Nafisa Olds ([id number]) on 02/13/2016  11:58:39 PM      MDM   Final diagnoses:  Chest pain, unspecified chest pain type   75 year old female who presents with chest pain. She is chest pain-free on my evaluation. Vital signs stable, but initially hypertensive, but this improves without intervention. Cardiopulmonary exam overall unremarkable. Her EKG is nonischemic and first troponin is negative. Chest x-ray without acute cardiopulmonary processes. I did screen for blood clot in the setting of her recent travel and pleuritic component of her pain. Is positive she will undergo CT angio chest for evaluation of pulmonary embolism. If negative, I am concerned about potential ACS. Her history of exertional pain with associated diaphoresis and nausea and dyspnea on exertion does increase her  heart score from 3 to 4. I have discussed this patient with Dr. Clyde LundborgNiu. She is pending CT scan, but will be admitted for observation for cardiac rule out.     Lavera Guiseana Duo Fares Ramthun, MD 02/14/16 [phone]

## 2016-02-14 NOTE — Progress Notes (Addendum)
*  Preliminary Results* Bilateral lower extremity venous duplex completed. Bilateral lower extremities are negative for deep vein thrombosis. There is evidence of right Baker's cyst, not left Baker's cyst.  02/14/2016  Gertie FeyMichelle Jonell Krontz, RVT, RDCS, RDMS

## 2016-02-14 NOTE — Care Management Obs Status (Signed)
MEDICARE OBSERVATION STATUS NOTIFICATION   Patient Details  Name: Samantha Mccullough MRN: [id number] Date of Birth: 10/15/1940   Medicare Observation Status Notification Given:  Yes    MahabirOlegario Messier, Naydelin Ziegler, RN 02/14/2016, 9:39 AM

## 2016-02-14 NOTE — Discharge Summary (Signed)
Samantha Guysancy Decou, is a 75 y.o. female  DOB Apr 20, 1941  MRN [id number].  Admission date:  02/13/2016  Admitting Physician  Lorretta HarpXilin Niu, MD  Discharge Date:  02/14/2016   Primary MD  No primary care provider on file.  Recommendations for primary care physician for things to follow:   Monitor and follow secondary risk factors for CAD, follow with local cardiologist within a week.  Review CT scan results.   Admission Diagnosis  HLD (hyperlipidemia) [E78.5] Essential hypertension [I10] Chest pain, unspecified chest pain type [R07.9]   Discharge Diagnosis  HLD (hyperlipidemia) [E78.5] Essential hypertension [I10] Chest pain, unspecified chest pain type [R07.9]    Principal Problem:   Chest pain Active Problems:   Hypertension   Hypertensive urgency   Hypokalemia   Migraine   HLD (hyperlipidemia)      Past Medical History  Diagnosis Date  . Hypertension   . Kidney stone   . Migraine   . HLD (hyperlipidemia)     Past Surgical History  Procedure Laterality Date  . Cardiac electrophysiology mapping and ablation    . Cardiac catheterization    . Appendectomy    . Hemorroidectomy    . Cholecystectomy         HPI  from the history and physical done on the day of admission:    Samantha Mccullough is a 75 y.o. female with medical history significant of hypertension, hyperlipidemia, migraine headaches, kidney stone, SVT (as a/P ablation), who presents with chest pain and shortness of breath.  Patient reports that she started having chest pain today. It is located in the substernal area, constant, 5 out of 10 in severity, dull, radiating to the left shoulder. It is pleuritic and aggravated by deep breath. It is associated with nausea and diaphoresis. Patient denies fever, chills, cough. Of note patient recently traveled  here from [place] by driving. She does not have tenderness over calf areas. Patient denies abdominal pain, symptoms of UTI, vomiting, unilateral weakness. She had loose stool yesterday, but no diarrhea today. Patient had elevated blood pressure 173/109 ED, which improved to 145/76. Currently patient is chest pain-free.  ED Course: pt was found to have positive d-dimer 0.71, negative troponin, WBC 6.1, temperature normal, no tachycardia, no tachypnea, potassium 3.4, creatinine 1.07, negative chest x-ray for acute abnormalities. Patient on telemetry bed for observation     Hospital Course:    Chest pain. Atypical, ruled out for MI, seen by Cards, ok for DC on home medications with addition of some lingual nitroglycerin as needed, patient was told to follow-up with PCP and local cardiologist within a week for outpatient stress test.   Dyslipidemia. Continue home dose statin.  Hypertension. Continue home regimen.  Right lower lobe lung nodule. Must follow with PCP within a week she will require repeat imaging, may benefit from one-time outpatient pulmonary follow-up locally [place].   Nonspecific mild elevation of d-dimer. CT angiogram chest and lower extremity venous ultrasound both unremarkable.   Follow UP  Follow-up Information    Follow up  with PCP. Schedule an appointment as soon as possible for a visit in 3 days.   Why:  and a local cardiologist within 1 week       Consults obtained - Cards  Discharge Condition: Stable  Diet and Activity recommendation: See Discharge Instructions below  Discharge Instructions           Discharge Instructions    Diet - low sodium heart healthy    Complete by:  As directed      Discharge instructions    Complete by:  As directed   Follow with Primary MD in 7 days . Follow CT scan results with your primary care physician.  Get CBC, CMP, 2 view Chest X ray checked  by Primary MD next visit ( we routinely change or add medications that  can affect your baseline labs and fluid status, therefore we recommend that you get the mentioned basic workup next visit with your PCP, your PCP may decide not to get them or add new tests based on their clinical decision)   Activity: As tolerated with Full fall precautions use walker/cane & assistance as needed   Disposition Home     Diet:   Heart Healthy.  For Heart failure patients - Check your Weight same time everyday, if you gain over 2 pounds, or you develop in leg swelling, experience more shortness of breath or chest pain, call your Primary MD immediately. Follow Cardiac Low Salt Diet and 1.5 lit/day fluid restriction.   On your next visit with your primary care physician please Get Medicines reviewed and adjusted.   Please request your Prim.MD to go over all Hospital Tests and Procedure/Radiological results at the follow up, please get all Hospital records sent to your Prim MD by signing hospital release before you go home.   If you experience worsening of your admission symptoms, develop shortness of breath, life threatening emergency, suicidal or homicidal thoughts you must seek medical attention immediately by calling 911 or calling your MD immediately  if symptoms less severe.  You Must read complete instructions/literature along with all the possible adverse reactions/side effects for all the Medicines you take and that have been prescribed to you. Take any new Medicines after you have completely understood and accpet all the possible adverse reactions/side effects.   Do not drive, operate heavy machinery, perform activities at heights, swimming or participation in water activities or provide baby sitting services if your were admitted for syncope or siezures until you have seen by Primary MD or a Neurologist and advised to do so again.  Do not drive when taking Pain medications.    Do not take more than prescribed Pain, Sleep and Anxiety Medications  Special  Instructions: If you have smoked or chewed Tobacco  in the last 2 yrs please stop smoking, stop any regular Alcohol  and or any Recreational drug use.  Wear Seat belts while driving.   Please note  You were cared for by a hospitalist during your hospital stay. If you have any questions about your discharge medications or the care you received while you were in the hospital after you are discharged, you can call the unit and asked to speak with the hospitalist on call if the hospitalist that took care of you is not available. Once you are discharged, your primary care physician will handle any further medical issues. Please note that NO REFILLS for any discharge medications will be authorized once you are discharged, as it is imperative that you  return to your primary care physician (or establish a relationship with a primary care physician if you do not have one) for your aftercare needs so that they can reassess your need for medications and monitor your lab values.     Increase activity slowly    Complete by:  As directed              Discharge Medications       Medication List    TAKE these medications        amitriptyline 75 MG tablet  Commonly known as:  ELAVIL  Take by mouth at bedtime.     atorvastatin 20 MG tablet  Commonly known as:  LIPITOR  Take 1 tablet by mouth daily.     calcium-vitamin D 500-200 MG-UNIT tablet  Commonly known as:  OSCAL WITH D  Take 1 tablet by mouth 2 (two) times daily.     Fish Oil 1200 MG Caps  Take 1 capsule by mouth 2 (two) times daily.     Flaxseed Oil 1000 MG Caps  Take 1,000 mg by mouth 2 (two) times daily.     metoprolol 100 MG tablet  Commonly known as:  LOPRESSOR  Take 100 mg by mouth 2 (two) times daily.     nitroGLYCERIN 0.4 MG SL tablet  Commonly known as:  NITROSTAT  Place 1 tablet (0.4 mg total) under the tongue every 5 (five) minutes as needed for chest pain.     valsartan-hydrochlorothiazide 160-25 MG tablet    Commonly known as:  DIOVAN-HCT  Take 1 tablet by mouth daily.        Major procedures and Radiology Reports - PLEASE review detailed and final reports for all details, in brief -    Lower extremity venous duplex. No DVT SVT.  Echocardiogram  LVEF 55-60%, normal wall thickness, normal wall motion, diastolic  dysfunction, indeterminate LV filling pressure, normal LA size,  normal IVC.   Dg Chest 2 View  02/13/2016  CLINICAL DATA:  75 year old female with chest pain EXAM: CHEST  2 VIEW COMPARISON:  Chest radiograph dated 08/20/2011 FINDINGS: The heart size and mediastinal contours are within normal limits. Both lungs are clear. The visualized skeletal structures are unremarkable. IMPRESSION: No active cardiopulmonary disease. Electronically Signed   By: Elgie Collard M.D.   On: 02/13/2016 22:32   Ct Angio Chest Pe W/cm &/or Wo Cm  02/14/2016  CLINICAL DATA:  75 year old female with pleuritic chest pain and shortness of breath EXAM: CT ANGIOGRAPHY CHEST WITH CONTRAST TECHNIQUE: Multidetector CT imaging of the chest was performed using the standard protocol during bolus administration of intravenous contrast. Multiplanar CT image reconstructions and MIPs were obtained to evaluate the vascular anatomy. CONTRAST:  100 cc Isovue 370 COMPARISON:  Chest radiograph dated 02/13/2016 FINDINGS: There are dependent atelectatic changes of the lung bases. There is a 9 mm oval pulmonary nodules at the right lung base with smooth borders and central calcification. The pattern of calcification is most suggestive of a benign process such as a granuloma or a hamartoma. Follow-up is recommended. The lungs are otherwise clear. There is no pleural effusion or pneumothorax. The central airways are patent. There is atherosclerotic calcification of the thoracic aorta. The origins of the great vessels of the aortic arch appear patent. There is no CT evidence of pulmonary embolism. No cardiomegaly or pericardial  effusion. Right hilar adenopathy noted. There is a small hiatal hernia. The esophagus is grossly normal no thyroid nodules identified. There is no axillary  adenopathy. The chest wall soft tissues appear unremarkable. There is osteopenia with degenerative changes of the spine. No acute fracture. The visualized upper abdomen appears unremarkable. Review of the MIP images confirms the above findings. IMPRESSION: No acute intrathoracic pathology. No CT evidence of pulmonary embolism. A 9 mm right lower lobe pulmonary nodule with central calcification likely a benign granuloma or hamartoma. Consider one of the following in 3 months for both low-risk and high-risk individuals: (a) repeat chest CT, (b) follow-up PET-CT, or (c) tissue sampling. This recommendation follows the consensus statement: Guidelines for Management of Incidental Pulmonary Nodules Detected on CT Images:From the Fleischner Society 2017; published online before print (10.1148/radiol.4401027253). Electronically Signed   By: Elgie Collard M.D.   On: 02/14/2016 02:36    Micro Results      No results found for this or any previous visit (from the past 240 hour(s)).     Today   Subjective    Samantha Guys today has no headache,no chest abdominal pain,no new weakness tingling or numbness, feels much better wants to go home today.     Objective   Blood pressure 128/69, pulse 73, temperature 98.5 F (36.9 C), temperature source Oral, resp. rate 16, height  (1.676 m), weight 77.157 kg (170 lb 1.6 oz), SpO2 98 %.   Intake/Output Summary (Last 24 hours) at 02/14/16 1658 Last data filed at 02/14/16 1300  Gross per 24 hour  Intake 528.75 ml  Output      0 ml  Net 528.75 ml    Exam Awake Alert, Oriented x 3, No new F.N deficits, Normal affect [place].AT,PERRAL Supple Neck,No JVD, No cervical lymphadenopathy appriciated.  Symmetrical Chest wall movement, Good air movement bilaterally, CTAB RRR,No Gallops,Rubs or new Murmurs, No  Parasternal Heave +ve B.Sounds, Abd Soft, Non tender, No organomegaly appriciated, No rebound -guarding or rigidity. No Cyanosis, Clubbing or edema, No new Rash or bruise   Data Review   CBC w Diff:  Lab Results  Component Value Date   WBC 6.1 02/13/2016   HGB 13.0 02/13/2016   HCT 38.1 02/13/2016   PLT 170 02/13/2016    CMP:  Lab Results  Component Value Date   NA 134* 02/13/2016   K 3.9 02/14/2016   CL 101 02/13/2016   CO2 24 02/13/2016   BUN 33* 02/13/2016   CREATININE 1.07* 02/13/2016  .   Total Time in preparing paper work, data evaluation and todays exam - 35 minutes  Leroy Sea M.D on 02/14/2016 at 4:58 PM  [facility]   Office  [phone]

## 2016-02-14 NOTE — Progress Notes (Signed)
Patient has been instructed to follow up with PCP in 5 days regarding CT scan results.  She understands and verbalized she will be calling them tomorrow to set up follow up appointment.  No further questions at this time, patient is ready for discharge.

## 2016-02-14 NOTE — Progress Notes (Signed)
PHARMACIST - PHYSICIAN ORDER COMMUNICATION  CONCERNING: P&T Medication Policy on Herbal Medications  DESCRIPTION:  This patient's order for:  Flaxseed oil  has been noted.  This product(s) is classified as an "herbal" or natural product. Due to a lack of definitive safety studies or FDA approval, nonstandard manufacturing practices, plus the potential risk of unknown drug-drug interactions while on inpatient medications, the Pharmacy and Therapeutics Committee does not permit the use of "herbal" or natural products of this type within [facility].   ACTION TAKEN: The pharmacy department is unable to verify this order at this time and your patient has been informed of this safety policy. Please reevaluate patient's clinical condition at discharge and address if the herbal or natural product(s) should be resumed at that time.  Thanks Lorenza EvangelistGreen, Zayanna Pundt R 02/14/2016 3:27 AM

## 2016-02-14 NOTE — Discharge Instructions (Signed)
Follow with Primary MD in 7 days . Follow CT scan results with your primary care physician.  Get CBC, CMP, 2 view Chest X ray checked  by Primary MD next visit ( we routinely change or add medications that can affect your baseline labs and fluid status, therefore we recommend that you get the mentioned basic workup next visit with your PCP, your PCP may decide not to get them or add new tests based on their clinical decision)   Activity: As tolerated with Full fall precautions use walker/cane & assistance as needed   Disposition Home     Diet:   Heart Healthy.  For Heart failure patients - Check your Weight same time everyday, if you gain over 2 pounds, or you develop in leg swelling, experience more shortness of breath or chest pain, call your Primary MD immediately. Follow Cardiac Low Salt Diet and 1.5 lit/day fluid restriction.   On your next visit with your primary care physician please Get Medicines reviewed and adjusted.   Please request your Prim.MD to go over all Hospital Tests and Procedure/Radiological results at the follow up, please get all Hospital records sent to your Prim MD by signing hospital release before you go home.   If you experience worsening of your admission symptoms, develop shortness of breath, life threatening emergency, suicidal or homicidal thoughts you must seek medical attention immediately by calling 911 or calling your MD immediately  if symptoms less severe.  You Must read complete instructions/literature along with all the possible adverse reactions/side effects for all the Medicines you take and that have been prescribed to you. Take any new Medicines after you have completely understood and accpet all the possible adverse reactions/side effects.   Do not drive, operate heavy machinery, perform activities at heights, swimming or participation in water activities or provide baby sitting services if your were admitted for syncope or siezures until you  have seen by Primary MD or a Neurologist and advised to do so again.  Do not drive when taking Pain medications.    Do not take more than prescribed Pain, Sleep and Anxiety Medications  Special Instructions: If you have smoked or chewed Tobacco  in the last 2 yrs please stop smoking, stop any regular Alcohol  and or any Recreational drug use.  Wear Seat belts while driving.   Please note  You were cared for by a hospitalist during your hospital stay. If you have any questions about your discharge medications or the care you received while you were in the hospital after you are discharged, you can call the unit and asked to speak with the hospitalist on call if the hospitalist that took care of you is not available. Once you are discharged, your primary care physician will handle any further medical issues. Please note that NO REFILLS for any discharge medications will be authorized once you are discharged, as it is imperative that you return to your primary care physician (or establish a relationship with a primary care physician if you do not have one) for your aftercare needs so that they can reassess your need for medications and monitor your lab values.

## 2016-02-14 NOTE — Care Management Note (Signed)
Case Management Note  Patient Details  Name: Samantha Mccullough MRN: [id number] Date of Birth: 06/30/1941  Subjective/Objective:75 y/o f admitted w/chest pain. From out of town visiting family.                     Action/Plan:d/c plan home.   Expected Discharge Date:                  Expected Discharge Plan:  Home/Self Care  In-House Referral:     Discharge planning Services  CM Consult  Post Acute Care Choice:    Choice offered to:     DME Arranged:    DME Agency:     HH Arranged:    HH Agency:     Status of Service:  In process, will continue to follow  If discussed at Long Length of Stay Meetings, dates discussed:    Additional Comments:  Lanier ClamMahabir, Lisle Skillman, RN 02/14/2016, 9:39 AM

## 2016-02-14 NOTE — ED Notes (Signed)
Patient transported to CT 

## 2016-02-14 NOTE — Progress Notes (Signed)
Echocardiogram 2D Echocardiogram has been performed.  Nolon RodBrown, Tony 02/14/2016, 10:35 AM

## 2016-02-15 LAB — HEMOGLOBIN A1C
HEMOGLOBIN A1C: 5.5 % (ref 4.8–5.6)
MEAN PLASMA GLUCOSE: 111 mg/dL

## 2016-02-15 MED FILL — NITROGLYCERIN 0.4 MG TAB SL: 0.4 | 8 days supply | Qty: 25 | Fill #0

## 2017-05-09 IMAGING — CT CT ANGIO CHEST
2 of 6 series · 18 of 36 positions shown · IV contrast (ISOVUE 370)
Comparison: Chest radiograph dated 02/13/2016

CLINICAL DATA: 75-year-old female with pleuritic chest pain and
shortness of breath

EXAM:
CT ANGIOGRAPHY CHEST WITH CONTRAST
TECHNIQUE: Multidetector CT imaging of the chest was performed using the
standard protocol during bolus administration of intravenous
contrast. Multiplanar CT image reconstructions and MIPs were
obtained to evaluate the vascular anatomy.
CONTRAST:  100 cc Isovue 370

[Series 5: thins for pacs · axial · 0.65mm/px · z∈[+1534,+1760]mm · 17 of 252 slices shown]
[im 13/252  lung]
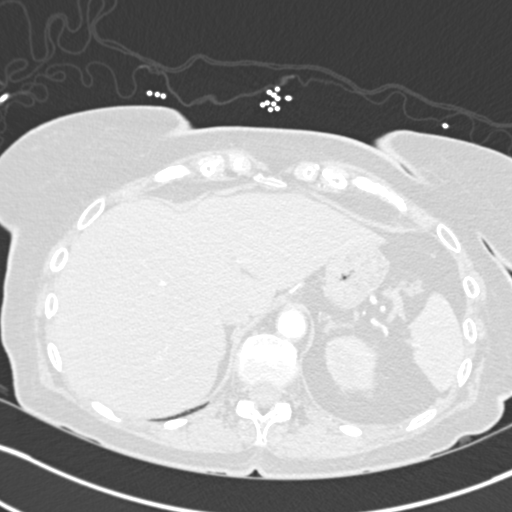
[im 26/252  mediastinal]
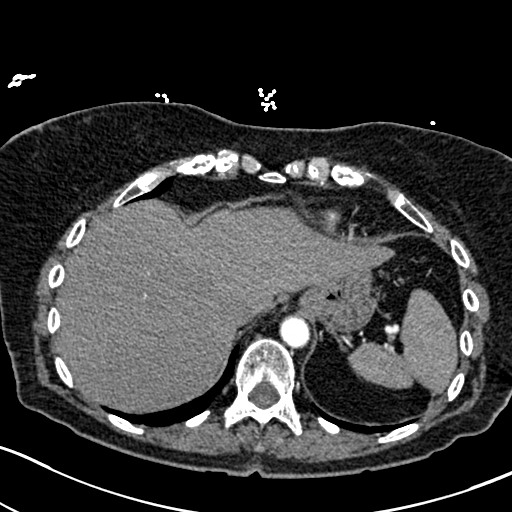
[im 38/252  lung]
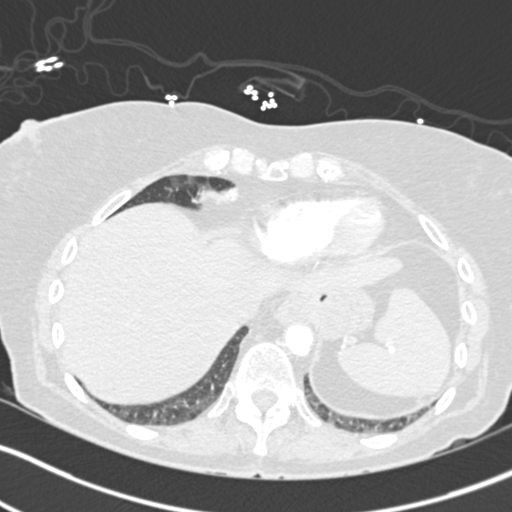
[im 51/252  mediastinal]
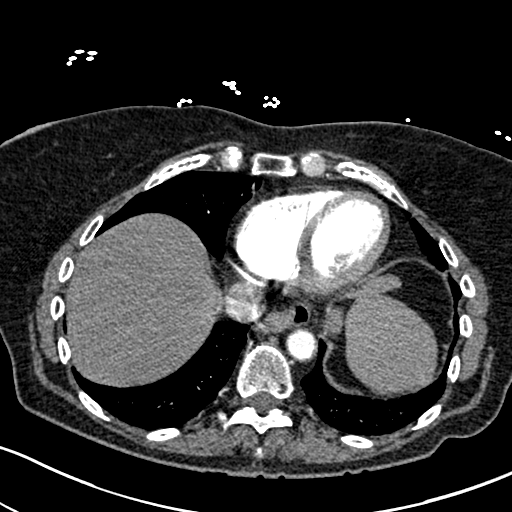
[im 76/252  lung]
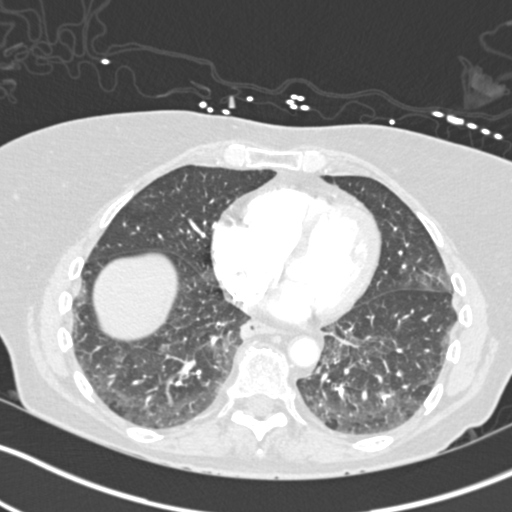
[im 88/252  mediastinal]
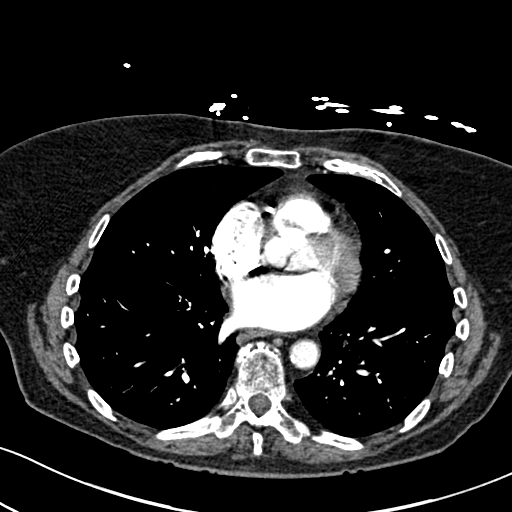
[im 101/252  lung]
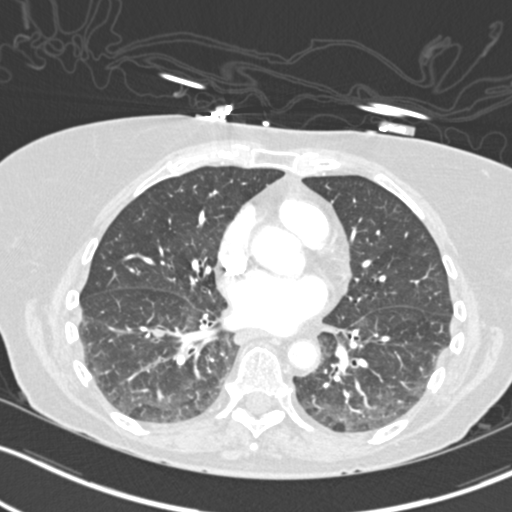
[im 113/252  mediastinal]
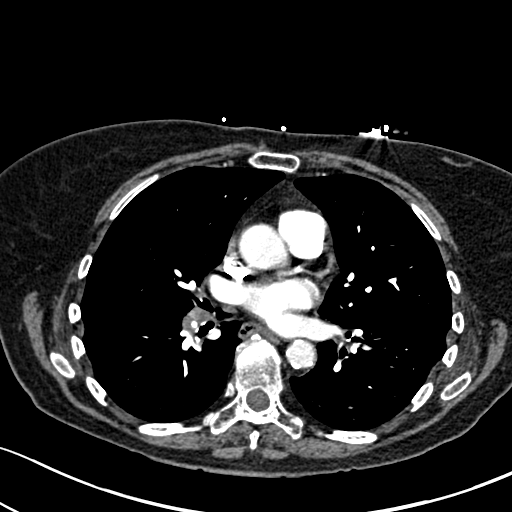
[im 126/252  lung]
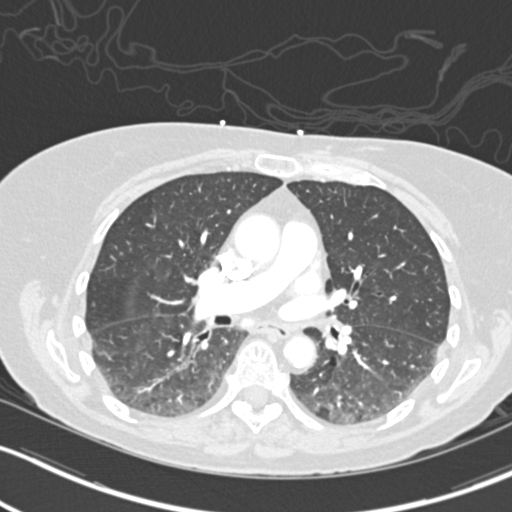
[im 139/252  mediastinal]
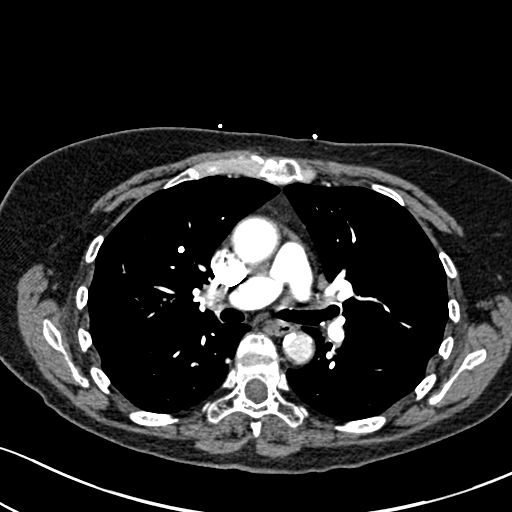
[im 151/252  lung]
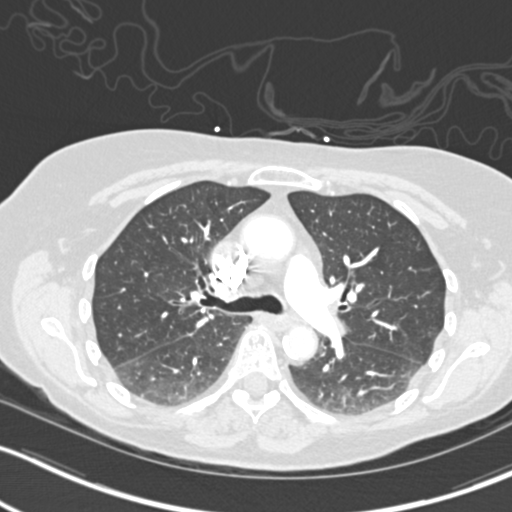
[im 164/252  mediastinal]
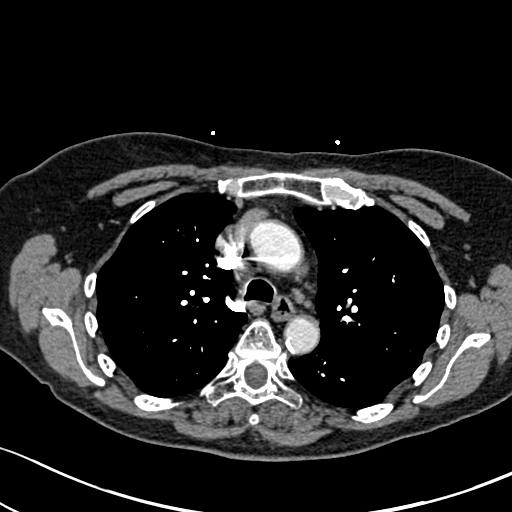
[im 176/252  lung]
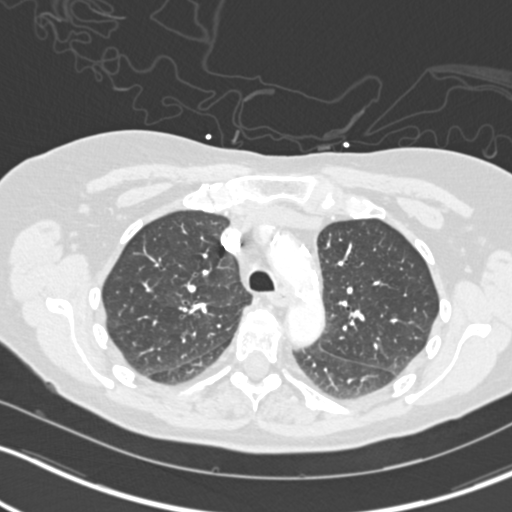
[im 201/252  mediastinal]
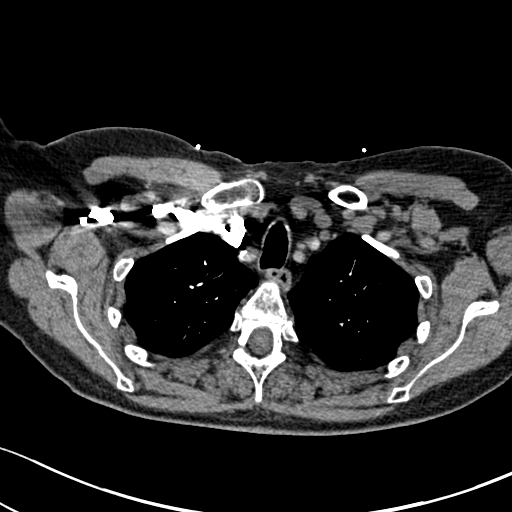
[im 214/252  lung]
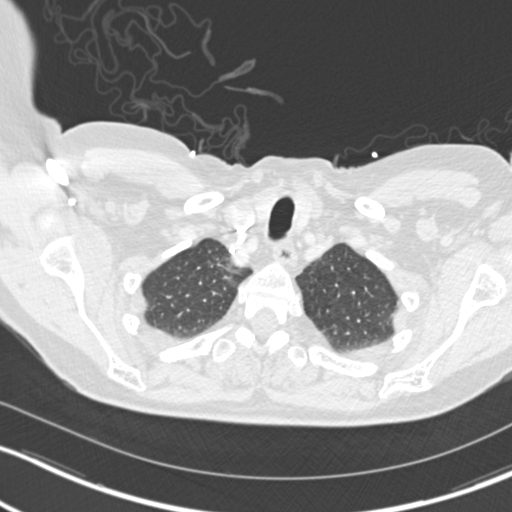
[im 226/252  mediastinal]
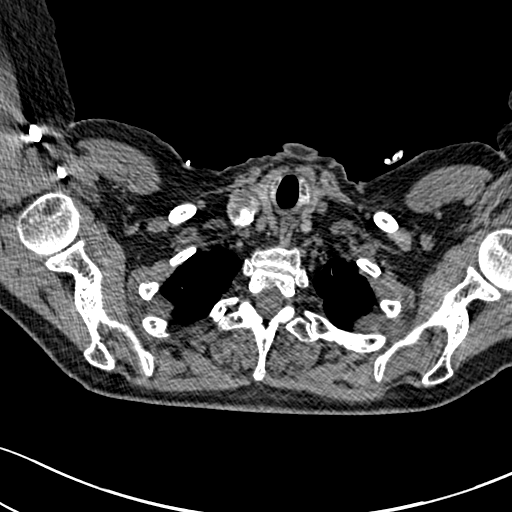
[im 239/252  lung]
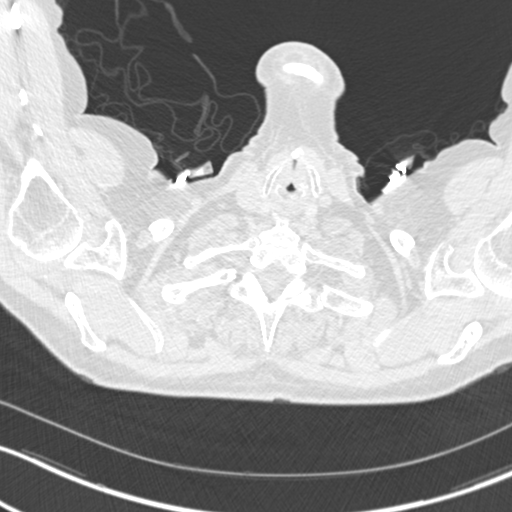

[Series 7: coronal mpr · coronal · 0.49mm/px · 1 of 104 slices shown]
[im 52/104  mediastinal]
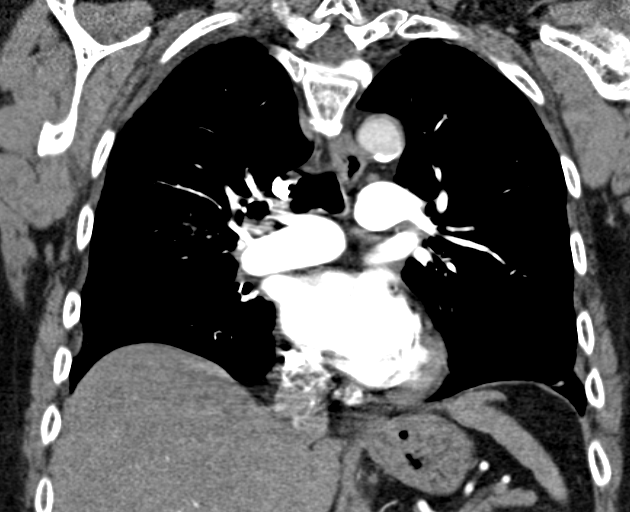

[18 of 36 positions shown; findings below may reference images not displayed]

FINDINGS: There are dependent atelectatic changes of the lung bases. There is
a 9 mm oval pulmonary nodules at the right lung base with smooth
borders and central calcification. The pattern of calcification is
most suggestive of a benign process such as a granuloma or a
hamartoma. Follow-up is recommended. The lungs are otherwise clear.
There is no pleural effusion or pneumothorax. The central airways
are patent.

There is atherosclerotic calcification of the thoracic aorta. The
origins of the great vessels of the aortic arch appear patent. There
is no CT evidence of pulmonary embolism. No cardiomegaly or
pericardial effusion. Right hilar adenopathy noted.

There is a small hiatal hernia. The esophagus is grossly normal no
thyroid nodules identified.

There is no axillary adenopathy. The chest wall soft tissues appear
unremarkable. There is osteopenia with degenerative changes of the
spine. No acute fracture.

The visualized upper abdomen appears unremarkable.

Review of the MIP images confirms the above findings.
IMPRESSION: No acute intrathoracic pathology. No CT evidence of pulmonary
embolism.

A 9 mm right lower lobe pulmonary nodule with central calcification
likely a benign granuloma or hamartoma. Consider one of the
following in 3 months for both low-risk and high-risk individuals:
(a) repeat chest CT, (b) follow-up PET-CT, or (c) tissue sampling.
This recommendation follows the consensus statement: Guidelines for
Management of Incidental Pulmonary Nodules Detected on CT
Images:From the [HOSPITAL] 2068; published online before
print (10.1148/radiol.[id number]).

## 2020-10-19 ENCOUNTER — Emergency Department (HOSPITAL_COMMUNITY): Payer: Medicare Other

## 2020-10-19 ENCOUNTER — Other Ambulatory Visit: Payer: Self-pay

## 2020-10-19 ENCOUNTER — Encounter (HOSPITAL_COMMUNITY): Payer: Self-pay | Admitting: Pharmacy Technician

## 2020-10-19 ENCOUNTER — Emergency Department (HOSPITAL_COMMUNITY)
Admission: EM | Admit: 2020-10-19 | Discharge: 2020-10-19 | Disposition: A | Discharge status: Home / Self Care | Payer: Medicare Other | Attending: Emergency Medicine | Admitting: Emergency Medicine

## 2020-10-19 DIAGNOSIS — I10 Essential (primary) hypertension: Secondary | ICD-10-CM | POA: Diagnosis not present

## 2020-10-19 DIAGNOSIS — L819 Disorder of pigmentation, unspecified: Secondary | ICD-10-CM | POA: Insufficient documentation

## 2020-10-19 DIAGNOSIS — Z79899 Other long term (current) drug therapy: Secondary | ICD-10-CM | POA: Insufficient documentation

## 2020-10-19 DIAGNOSIS — R0602 Shortness of breath: Secondary | ICD-10-CM | POA: Diagnosis not present

## 2020-10-19 LAB — BASIC METABOLIC PANEL
Anion gap: 9 (ref 5–15)
BUN: 16 mg/dL (ref 8–23)
CO2: 25 mmol/L (ref 22–32)
Calcium: 9.3 mg/dL (ref 8.9–10.3)
Chloride: 103 mmol/L (ref 98–111)
Creatinine, Ser: 0.86 mg/dL (ref 0.44–1.00)
GFR, Estimated: >60 mL/min (ref >60)
Glucose, Bld: 119 mg/dL — ABNORMAL HIGH (ref 70–99)
Potassium: 3.4 mmol/L — ABNORMAL LOW (ref 3.5–5.1)
Sodium: 137 mmol/L (ref 135–145)

## 2020-10-19 LAB — CBC
HCT: 41.3 % (ref 36.0–46.0)
Hemoglobin: 13.4 g/dL (ref 12.0–15.0)
MCH: 27.7 pg (ref 26.0–34.0)
MCHC: 32.4 g/dL (ref 30.0–36.0)
MCV: 85.3 fL (ref 80.0–100.0)
Platelets: 149 10*3/uL — ABNORMAL LOW (ref 150–400)
RBC: 4.84 MIL/uL (ref 3.87–5.11)
RDW: 12.2 % (ref 11.5–15.5)
WBC: 6 10*3/uL (ref 4.0–10.5)
nRBC: 0 % (ref 0.0–0.2)

## 2020-10-19 NOTE — ED Provider Notes (Signed)
[facility] EMERGENCY DEPARTMENT Provider Note   CSN: [id number] Arrival date & time: 10/19/20  1148     History No chief complaint on file.   Samantha Mccullough is a 80 y.o. female with hx of HTN, SVT s/p ablation, migraines and HLD here for toe cyanosis.   HPI  Patient from [place] and is visiting family here in [place]. She reports gradual worsening of left toe cyanosis of  last 6 months. Cyanosis described as purplely-blue and is worse in her 2nd toe. Endorses decreased sensation in her 2nd toe. Denies claudication, swelling of LE, rash, calf pain. Feet have been itching. She also states she has been having shortness of breath. No known recent sick contacts. No hx of COVID, PE or DVT. She is COVID vaccinated and boosted.  Denies chest pain, palpitations, cough, fever.      Past Medical History:  Diagnosis Date  . HLD (hyperlipidemia)   . Hypertension   . Kidney stone   . Migraine     Patient Active Problem List   Diagnosis Date Noted  . Chest pain 02/14/2016  . Hypertensive urgency 02/14/2016  . Hypokalemia 02/14/2016  . Migraine 02/14/2016  . Hypertension   . HLD (hyperlipidemia)   . Pain in the chest   . Essential hypertension     Past Surgical History:  Procedure Laterality Date  . APPENDECTOMY    . CARDIAC CATHETERIZATION    . CARDIAC ELECTROPHYSIOLOGY MAPPING AND ABLATION    . CHOLECYSTECTOMY    . HEMORROIDECTOMY       OB History   No obstetric history on file.     Family History  Problem Relation Age of Onset  . Dementia Mother   . Pulmonary fibrosis Father     Social History   Tobacco Use  . Smoking status: Never Smoker  Substance Use Topics  . Alcohol use: No  . Drug use: No    Home Medications Prior to Admission medications   Medication Sig Start Date End Date Taking? Authorizing Provider  amitriptyline (ELAVIL) 75 MG tablet Take by mouth at bedtime.      [provider]  atorvastatin (LIPITOR) 20 MG tablet Take 1  tablet by mouth daily. 12/07/15   [provider]  calcium-vitamin D (OSCAL WITH D) 500-200 MG-UNIT tablet Take 1 tablet by mouth 2 (two) times daily.    [provider]  Flaxseed, Linseed, (FLAXSEED OIL) 1000 MG CAPS Take 1,000 mg by mouth 2 (two) times daily.      [provider]  metoprolol (LOPRESSOR) 100 MG tablet Take 100 mg by mouth 2 (two) times daily.      [provider]  nitroGLYCERIN (NITROSTAT) 0.4 MG SL tablet Place 1 tablet (0.4 mg total) under the tongue every 5 (five) minutes as needed for chest pain. 02/14/16   Leroy Sea, MD  Omega-3 Fatty Acids (FISH OIL) 1200 MG CAPS Take 1 capsule by mouth 2 (two) times daily.     [provider]  valsartan-hydrochlorothiazide (DIOVAN-HCT) 160-25 MG tablet Take 1 tablet by mouth daily. 02/06/16   [provider]    Allergies    Penicillins  Review of Systems   Review of Systems  Constitutional: Negative for fever.  HENT: Negative for sore throat.   Respiratory: Positive for shortness of breath. Negative for cough.   Cardiovascular: Negative for chest pain, palpitations and leg swelling.  Gastrointestinal: Negative for abdominal pain, diarrhea, nausea and vomiting.  Musculoskeletal: Negative for neck pain.  Skin: Positive for color change. Negative for rash.  Neurological: Positive for numbness. Negative for syncope and headaches. Light-headedness: orthostatic related   All other systems reviewed and are negative.   Physical Exam Updated Vital Signs BP (!) 147/85 (BP Location: Right Arm)   Pulse 75   Temp 97.7 F (36.5 C) (Oral)   Resp 16   SpO2 100%   Physical Exam Vitals and nursing note reviewed.  Constitutional:      Appearance: Normal appearance. She is not ill-appearing.  HENT:     Head: Normocephalic and atraumatic.     Nose: Nose normal.  Eyes:     General: No scleral icterus.    Conjunctiva/sclera: Conjunctivae normal.  Cardiovascular:     Rate and  Rhythm: Normal rate and regular rhythm.     Pulses: Normal pulses.     Heart sounds: No murmur heard.   Pulmonary:     Effort: Pulmonary effort is normal. No respiratory distress.     Breath sounds: Normal breath sounds. No wheezing, rhonchi or rales.  Abdominal:     Palpations: Abdomen is soft.     Tenderness: There is no abdominal tenderness.  Musculoskeletal:        General: No swelling, tenderness, deformity or signs of injury. Normal range of motion.     Cervical back: Normal range of motion and neck supple.     Comments: DP and TA pulses 2+ bilaterally, normal ROM  Skin:    Capillary Refill: Left foot  3 secs, right foot <2 sec       Neurological:     Mental Status: She is alert and oriented to person, place, and time. Mental status is at baseline.     Coordination: Coordination normal.  Psychiatric:        Mood and Affect: Mood normal.        Behavior: Behavior normal.     ED Results / Procedures / Treatments   Labs (all labs ordered are listed, but only abnormal results are displayed) Labs Reviewed  BASIC METABOLIC PANEL - Abnormal; Notable for the following components:      Result Value   Potassium 3.4 (*)    Glucose, Bld 119 (*)    All other components within normal limits  CBC - Abnormal; Notable for the following components:   Platelets 149 (*)    All other components within normal limits    EKG None  Radiology DG Chest 2 View  Result Date: 10/19/2020 CLINICAL DATA:  Shortness of breath EXAM: CHEST - 2 VIEW COMPARISON:  6/28/7 FINDINGS: The heart size and mediastinal contours are within normal limits. Both lungs are clear. The visualized skeletal structures are unremarkable. IMPRESSION: No active cardiopulmonary disease. Electronically Signed   By: Alcide Clever M.D.   On: 10/19/2020 12:36    Procedures Procedures   Medications Ordered in ED Medications - No data to display  ED Course  I have reviewed the triage vital signs and the nursing  notes.  Pertinent labs & imaging results that were available during my care of the patient were reviewed by me and considered in my medical decision making (see chart for details).    MDM Rules/Calculators/A&P                           Patient is a 80 yo female with hx of HTN, pSVT s/p ablation, migraines and HLD who presented for gradual worsening cyanosis of left toes over  the past 6 months. No cyanosis on exam. CXR unremarkable. No unilateral edema, no calf tenderness, no cyanosis, no tachycardia on exam. Very little concern for DVT, PE or PAD. Defer imaging at this time. Will discharge patient with PCP follow up. She agrees with plan.   Final Clinical Impression(s) / ED Diagnoses Final diagnoses:  Discoloration of skin of toe    Rx / DC Orders ED Discharge Orders    None       Katha Cabal, DO 10/19/20 1645    Milagros Loll, MD 10/22/20 1420

## 2020-10-19 NOTE — ED Triage Notes (Signed)
Pt here with reports of discoloration to her toes worsening over the last few days. Pt states R>L. Pt also reports some exertional shob.

## 2020-10-19 NOTE — Discharge Instructions (Addendum)
Follow up with your PCP, as discussed.   Take Care,   Dr. Katherina Right Emergency Department
